# Patient Record
Sex: Female | Born: 2005 | Race: Black or African American | Hispanic: No | Marital: Single
Health system: Southern US, Community
[De-identification: ages and names within clinical notes are randomized; demographics above are authoritative.]

## PROBLEM LIST (undated history)

## (undated) DIAGNOSIS — J45909 Unspecified asthma, uncomplicated: Secondary | ICD-10-CM

## (undated) DIAGNOSIS — J302 Other seasonal allergic rhinitis: Secondary | ICD-10-CM

---
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
## 2024-04-12 ENCOUNTER — Telehealth: Payer: Self-pay

## 2024-04-12 NOTE — Telephone Encounter (Signed)
 Copied from CRM [id number]. Topic: Appointments - Scheduling Inquiry for Clinic >> Apr 12, 2024  9:29 AM Kendralyn S wrote: Reason for CRM: her mother and sister are patients of dr copeland and mother called requesting if this patient can be set up with dr copeland.  [phone] mom is sharlet balloon

## 2024-04-12 NOTE — Telephone Encounter (Signed)
 Lvm for her to sched

## 2024-04-13 ENCOUNTER — Ambulatory Visit: Payer: Self-pay

## 2024-04-13 NOTE — Telephone Encounter (Signed)
 Error CRM sent.   Pt is not a Pt of our office. Dr Watt is not accepting new patients at this time. Pt should have been instructed that she will need to make a NP appt w/ a provider who is accepting new patients in our office but to go to [facility] for prompt eval.

## 2024-04-13 NOTE — Telephone Encounter (Signed)
 FYI Only or Action Required?: Action required by provider: request for appointment.  Patient was last seen in primary care on .  Called Nurse Triage reporting Rash.  Symptoms began several days ago.  Interventions attempted: OTC medications: benadryl.  Symptoms are: stable.  Triage Disposition: No disposition on file.  Patient/caregiver understands and will follow disposition?:    Copied from CRM [id number]. Topic: Clinical - Red Word Triage >> Apr 13, 2024 11:44 AM Richerd A wrote: [facility] that prompted transfer to Nurse Triage: Patient has allergic reaction has rash on legs and wrists, some swelling itching has subsided some have used Benadryl cream Reason for Disposition  Mild widespread rash  (Exception: Heat rash lasting 3 days or less.)  Answer Assessment - Initial Assessment Questions Mother is pt of Dr. Watt. Mother has acute visit scheduled for tomorrow she will give up to have rash looked at.     1. APPEARANCE of RASH: What does the rash look like? (e.g., blisters, dry flaky skin, red spots, redness, sores)     Red circles 2. SIZE: How big are the spots? (e.g., tip of pen, eraser, coin; inches, centimeters)     small 3. LOCATION: Where is the rash located?     legs 4. COLOR: What color is the rash? (Note: It is difficult to assess rash color in people with darker-colored skin. When this situation occurs, simply ask the caller to describe what they see.)     red 5. ONSET: When did the rash begin?     Saturday 6. FEVER: Do you have a fever? If Yes, ask: What is your temperature, how was it measured, and when did it start?     no 7. ITCHING: Does the rash itch? If Yes, ask: How bad is the itch? (Scale 1-10; or mild, moderate, severe)     Moderate itching 8. CAUSE: What do you think is causing the rash?     unknown 9. MEDICINE FACTORS: Have you started any new medicines within the last 2 weeks? (e.g., antibiotics)      no 10. OTHER  SYMPTOMS: Do you have any other symptoms? (e.g., dizziness, headache, sore throat, joint pain)       Denies  Protocols used: Rash or Redness - [facility]

## 2024-04-13 NOTE — Telephone Encounter (Signed)
 Gretta Lauraine HERO, RN   04/13/2024  3:31 PM  Thank you for submitting this issue for investigation. After a thorough review, we have determined that an error was made by an E2C2 team member. We have addressed the matter directly with the agent involved. We appreciate you bringing this to our attention.   Error/Investigation Details: Investigated by RN TL, RN coached on appropriate scheduling/routing workflow.

## 2024-04-14 NOTE — Telephone Encounter (Deleted)
 Copied from CRM [id number]. Topic: Clinical - Red Word Triage >> Apr 14, 2024  4:17 PM Wess RAMAN wrote: Red Word that prompted transfer to Nurse Triage: Patient's mother, Sharlet, states patient is still having allergic reaction and breaking out in hives.  Would like sooner appt.

## 2024-04-14 NOTE — Telephone Encounter (Signed)
 We moved up her appt to next week

## 2024-04-14 NOTE — Telephone Encounter (Signed)
 Patient's mother called back and states she had spoken to Dr. Watt about her daughter previously when in the office and that Dr. Watt said she can see her. Reports no change since triage, but had to disconnect as she was arriving at a different appointment. Would you be able to clarify with Dr. Watt? Thank you

## 2024-04-26 ENCOUNTER — Ambulatory Visit: Payer: PRIVATE HEALTH INSURANCE | Admitting: Family Medicine

## 2024-05-17 ENCOUNTER — Ambulatory Visit: Payer: PRIVATE HEALTH INSURANCE | Admitting: Family Medicine
